# Patient Record
Sex: Female | Born: 1965 | Race: Black or African American | Hispanic: No | Marital: Married | Smoking: Never smoker
Health system: Southern US, Community
[De-identification: ages and names within clinical notes are randomized; demographics above are authoritative.]

## PROBLEM LIST (undated history)

## (undated) DIAGNOSIS — I10 Essential (primary) hypertension: Secondary | ICD-10-CM

---

## 2012-10-21 ENCOUNTER — Other Ambulatory Visit (HOSPITAL_COMMUNITY): Payer: Self-pay | Admitting: Endocrinology

## 2012-10-21 DIAGNOSIS — E0590 Thyrotoxicosis, unspecified without thyrotoxic crisis or storm: Secondary | ICD-10-CM

## 2012-10-21 DIAGNOSIS — E042 Nontoxic multinodular goiter: Secondary | ICD-10-CM

## 2012-11-10 ENCOUNTER — Encounter (HOSPITAL_COMMUNITY)
Admission: RE | Admit: 2012-11-10 | Discharge: 2012-11-10 | Disposition: A | Discharge status: Home / Self Care | Payer: Commercial Managed Care - PPO | Source: Ambulatory Visit | Attending: Endocrinology | Admitting: Endocrinology

## 2012-11-10 DIAGNOSIS — E042 Nontoxic multinodular goiter: Secondary | ICD-10-CM | POA: Insufficient documentation

## 2012-11-10 DIAGNOSIS — E0590 Thyrotoxicosis, unspecified without thyrotoxic crisis or storm: Secondary | ICD-10-CM | POA: Insufficient documentation

## 2012-11-11 ENCOUNTER — Encounter (HOSPITAL_COMMUNITY)
Admission: RE | Admit: 2012-11-11 | Discharge: 2012-11-11 | Disposition: A | Discharge status: Home / Self Care | Payer: Commercial Managed Care - PPO | Source: Ambulatory Visit | Attending: Endocrinology | Admitting: Endocrinology

## 2012-11-11 MED ORDER — SODIUM PERTECHNETATE TC 99M INJECTION
9.8000 | Freq: Once | INTRAVENOUS | Status: AC | PRN
  Administered 2012-11-11: 10 via INTRAVENOUS

## 2012-11-11 MED ORDER — SODIUM IODIDE I 131 CAPSULE
15.9000 | Freq: Once | INTRAVENOUS | Status: AC | PRN
  Administered 2012-11-10: 15.9 via ORAL

## 2012-11-20 ENCOUNTER — Other Ambulatory Visit: Payer: Self-pay | Admitting: Endocrinology

## 2012-11-20 DIAGNOSIS — E042 Nontoxic multinodular goiter: Secondary | ICD-10-CM

## 2012-12-03 ENCOUNTER — Inpatient Hospital Stay: Admission: RE | Admit: 2012-12-03 | Payer: Commercial Managed Care - PPO | Source: Ambulatory Visit

## 2012-12-03 ENCOUNTER — Inpatient Hospital Stay
Admission: RE | Admit: 2012-12-03 | Discharge: 2012-12-03 | Disposition: A | Discharge status: Home / Self Care | Payer: Commercial Managed Care - PPO | Source: Ambulatory Visit | Attending: Endocrinology | Admitting: Endocrinology

## 2012-12-09 ENCOUNTER — Ambulatory Visit
Admission: RE | Admit: 2012-12-09 | Discharge: 2012-12-09 | Disposition: A | Discharge status: Home / Self Care | Payer: Commercial Managed Care - PPO | Source: Ambulatory Visit | Attending: Endocrinology | Admitting: Endocrinology

## 2012-12-09 ENCOUNTER — Other Ambulatory Visit (HOSPITAL_COMMUNITY)
Admission: RE | Admit: 2012-12-09 | Discharge: 2012-12-09 | Disposition: A | Discharge status: Home / Self Care | Payer: Commercial Managed Care - PPO | Source: Ambulatory Visit | Attending: Interventional Radiology | Admitting: Interventional Radiology

## 2012-12-09 DIAGNOSIS — E049 Nontoxic goiter, unspecified: Secondary | ICD-10-CM | POA: Insufficient documentation

## 2012-12-09 DIAGNOSIS — E042 Nontoxic multinodular goiter: Secondary | ICD-10-CM

## 2014-03-04 IMAGING — US US THYROID BIOPSY
1 series · 13 of 25 positions shown · non-contrast
Comparison: none

CLINICAL DATA: Multinodular goiter.  Previous FNA biopsy of
dominant left lesion 09/12/2009.

ULTRASOUND-GUIDED THYROID ASPIRATION BIOPSY, RIGHT
TECHNIQUE: Survey ultrasound was performed and the dominant lesion
in the mid right lobe was localized.  An appropriate skin entry
site was determined.  Skin was marked, then prepped with Betadine,
draped in usual sterile fashion, and infiltrated locally with 1%
lidocaine.  Under real-time ultrasound guidance, 3  passes were
made into the lesion with 25 gauge needles.  The patient tolerated
procedure well, with no immediate complications.
IMPRESSION
1.  Technically successful ultrasound-guided thyroid aspiration
biopsy, dominant right nodule
.
ULTRASOUND-GUIDED THYROID ASPIRATION BIOPSY, ISTHMUS
in the
Isthmus   was localized.  An appropriate skin entry site was
determined.  Skin was marked, then prepped with Betadine, draped in
usual sterile fashion, and infiltrated locally with 1% lidocaine.
Under real-time ultrasound guidance, 3  passes were made into the
lesion with 25 gauge needles.  The patient tolerated procedure
well, with no immediate complications.
biopsy, dominant isthmus nodule
ULTRASOUND-GUIDED THYROID ASPIRATION BIOPSY, LEFT
in the mid-left lobe was localized.  An appropriate skin entry site
was determined.  Skin was marked, then prepped with Betadine,
made into the lesion with 25 gauge needles sampling both solid and
cystic components.  The patient tolerated procedure well, with no
immediate complications.
biopsy, dominant left nodule

[Series 1: us thyroid biopsy · 0.09mm/px · 33 acquisitions, 13 frames shown]
[im 1/33]
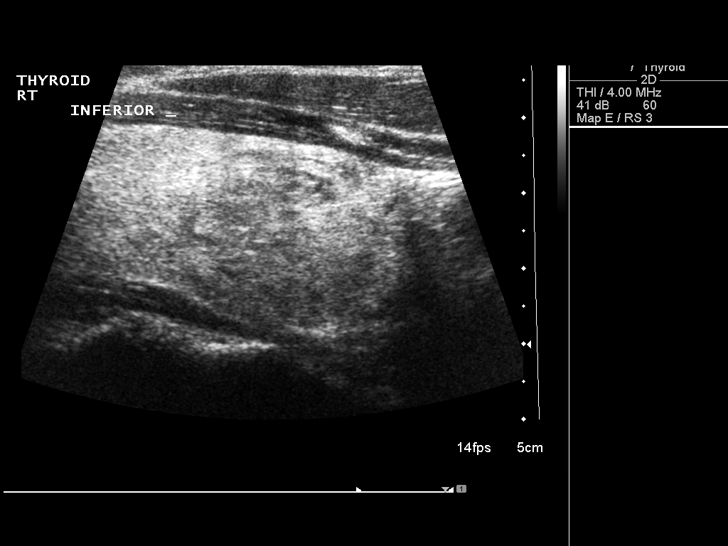
[im 3/33]
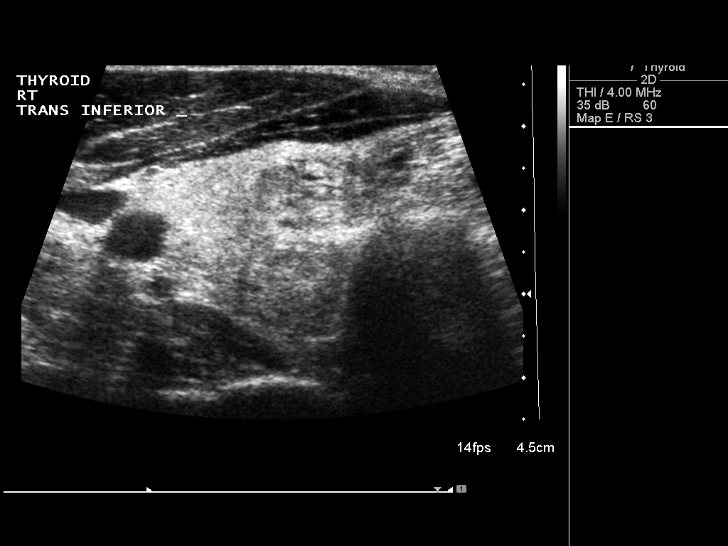
[im 6/33]
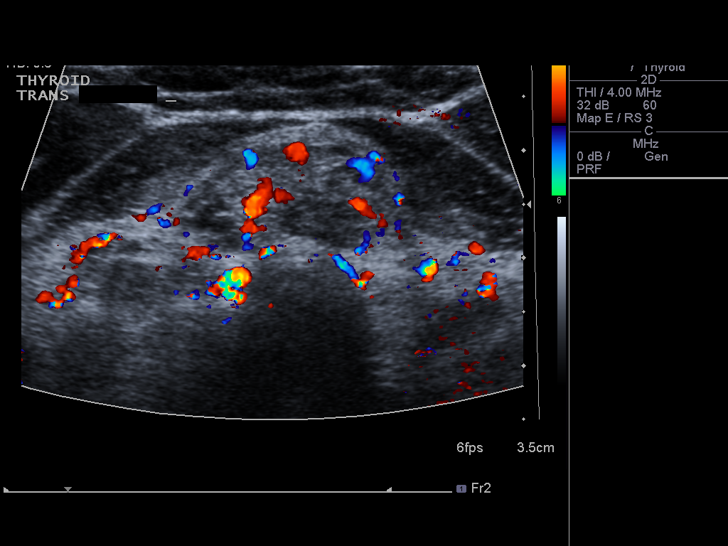
[im 9/33]
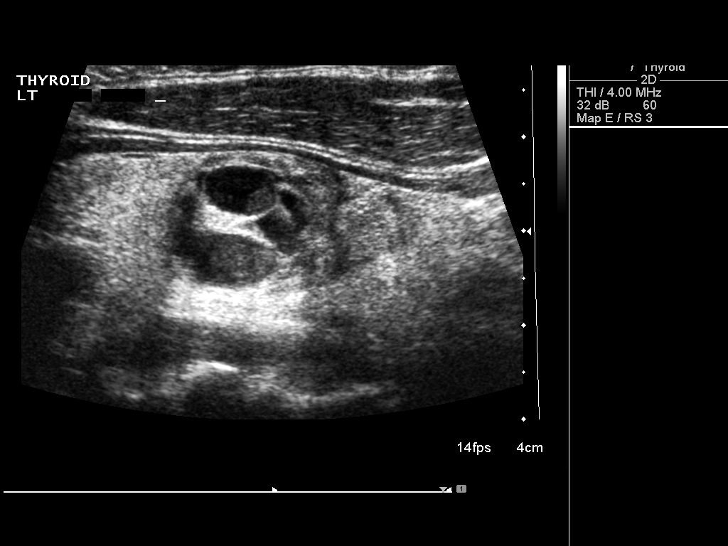
[im 11/33]
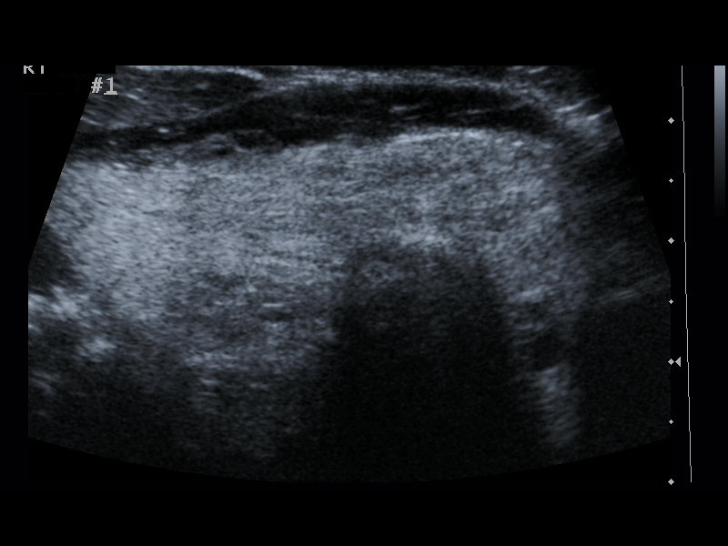
[im 14/33]
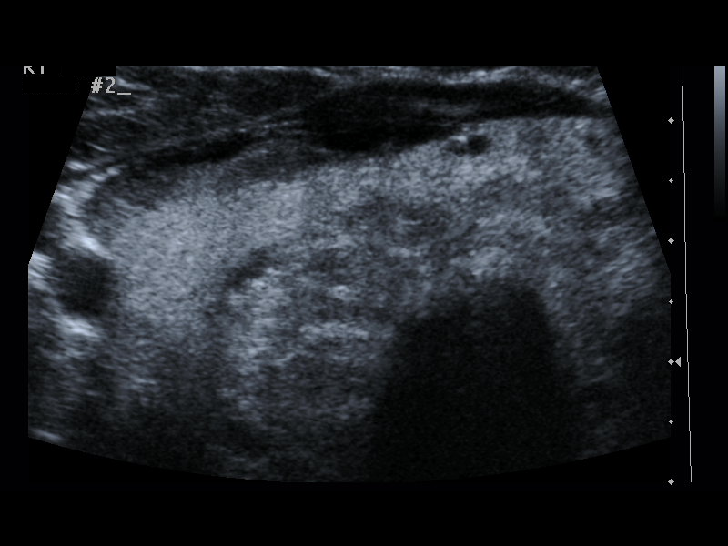
[im 17/33]
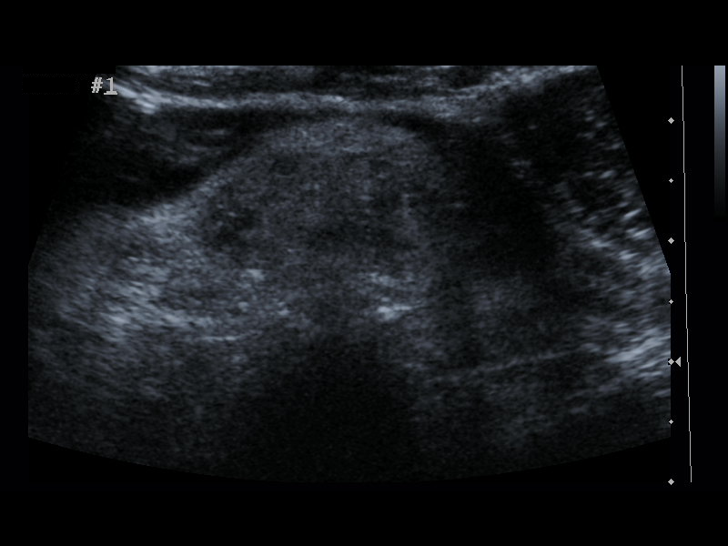
[im 19/33]
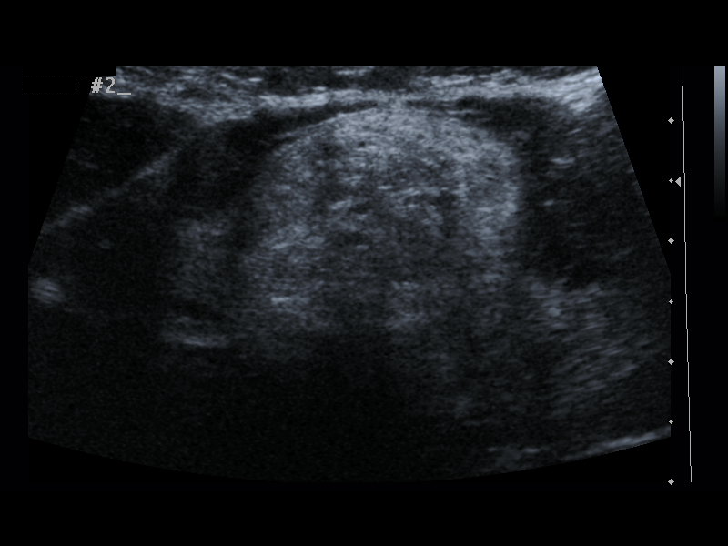
[im 22/33]
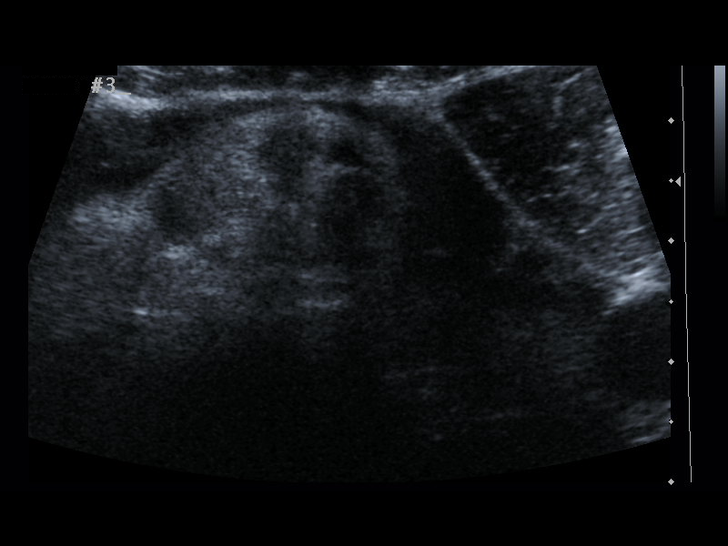
[im 25/33]
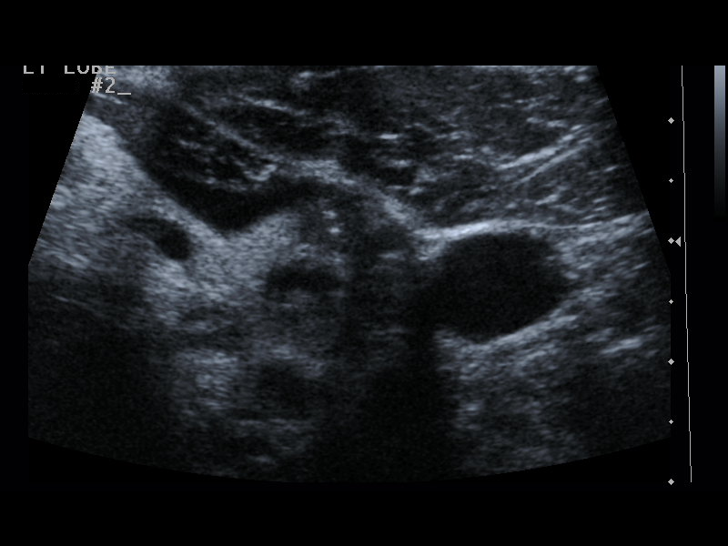
[im 27/33]
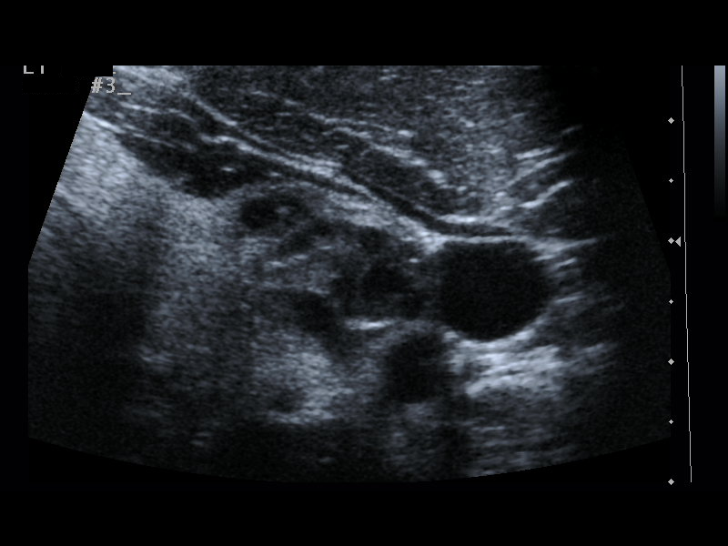
[im 30/33]
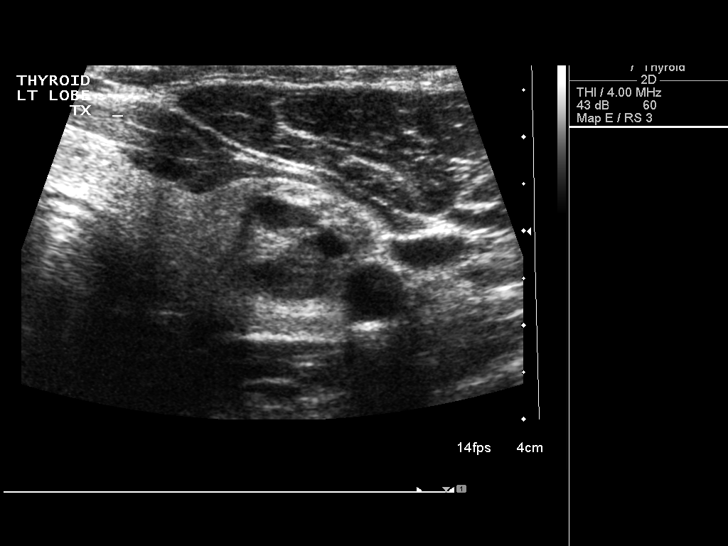
[im 33/33]
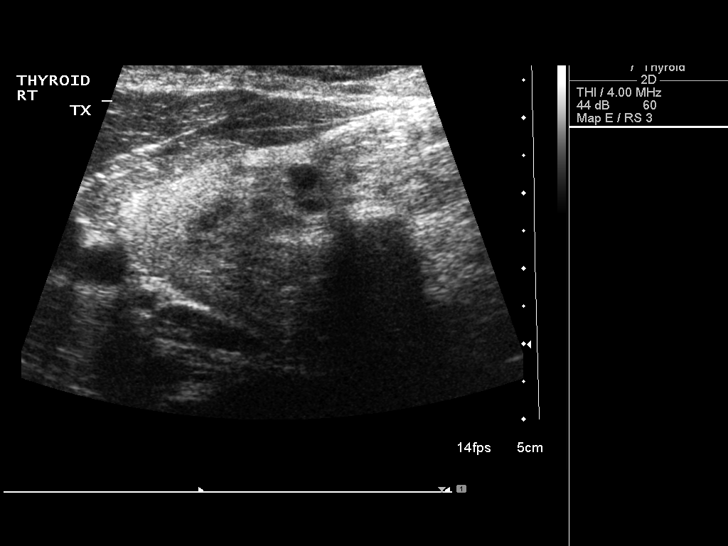

[13 of 25 positions shown; findings below may reference images not displayed]

## 2016-12-04 DIAGNOSIS — J069 Acute upper respiratory infection, unspecified: Secondary | ICD-10-CM | POA: Diagnosis not present

## 2017-03-25 DIAGNOSIS — Z1389 Encounter for screening for other disorder: Secondary | ICD-10-CM | POA: Diagnosis not present

## 2017-03-25 DIAGNOSIS — E0590 Thyrotoxicosis, unspecified without thyrotoxic crisis or storm: Secondary | ICD-10-CM | POA: Diagnosis not present

## 2017-03-25 DIAGNOSIS — E559 Vitamin D deficiency, unspecified: Secondary | ICD-10-CM | POA: Diagnosis not present

## 2017-03-25 DIAGNOSIS — J309 Allergic rhinitis, unspecified: Secondary | ICD-10-CM | POA: Diagnosis not present

## 2017-03-25 DIAGNOSIS — I10 Essential (primary) hypertension: Secondary | ICD-10-CM | POA: Diagnosis not present

## 2017-07-01 ENCOUNTER — Ambulatory Visit: Payer: Self-pay | Admitting: Podiatry

## 2017-07-05 ENCOUNTER — Encounter (INDEPENDENT_AMBULATORY_CARE_PROVIDER_SITE_OTHER): Payer: Self-pay

## 2017-07-05 ENCOUNTER — Encounter: Payer: Self-pay | Admitting: Sports Medicine

## 2017-07-05 ENCOUNTER — Ambulatory Visit (INDEPENDENT_AMBULATORY_CARE_PROVIDER_SITE_OTHER): Payer: Commercial Managed Care - PPO | Admitting: Sports Medicine

## 2017-07-05 ENCOUNTER — Ambulatory Visit (INDEPENDENT_AMBULATORY_CARE_PROVIDER_SITE_OTHER): Payer: Commercial Managed Care - PPO

## 2017-07-05 VITALS — BP 142/82 | HR 74 | Ht 66.0 in | Wt 256.0 lb

## 2017-07-05 DIAGNOSIS — M7752 Other enthesopathy of left foot: Secondary | ICD-10-CM

## 2017-07-05 DIAGNOSIS — M7661 Achilles tendinitis, right leg: Secondary | ICD-10-CM

## 2017-07-05 DIAGNOSIS — M7662 Achilles tendinitis, left leg: Secondary | ICD-10-CM | POA: Diagnosis not present

## 2017-07-05 DIAGNOSIS — M779 Enthesopathy, unspecified: Secondary | ICD-10-CM | POA: Diagnosis not present

## 2017-07-05 DIAGNOSIS — M722 Plantar fascial fibromatosis: Secondary | ICD-10-CM

## 2017-07-05 DIAGNOSIS — M79672 Pain in left foot: Secondary | ICD-10-CM | POA: Diagnosis not present

## 2017-07-05 DIAGNOSIS — M79671 Pain in right foot: Secondary | ICD-10-CM | POA: Diagnosis not present

## 2017-07-05 MED ORDER — METHYLPREDNISOLONE 4 MG PO TBPK: ORAL_TABLET | ORAL | 0 refills | Status: AC

## 2017-07-05 MED ORDER — MELOXICAM 15 MG PO TABS: 15.0000 mg | ORAL_TABLET | Freq: Every day | ORAL | 0 refills | Status: AC

## 2017-07-05 NOTE — Patient Instructions (Signed)
Plantar Fasciitis Rehab Ask your health care provider which exercises are safe for you. Do exercises exactly as told by your health care provider and adjust them as directed. It is normal to feel mild stretching, pulling, tightness, or discomfort as you do these exercises, but you should stop right away if you feel sudden pain or your pain gets worse. Do not begin these exercises until told by your health care provider. Stretching and range of motion exercises These exercises warm up your muscles and joints and improve the movement and flexibility of your foot. These exercises also help to relieve pain. Exercise A: Plantar fascia stretch  1. Sit with your left / right leg crossed over your opposite knee. 2. Hold your heel with one hand with that thumb near your arch. With your other hand, hold your toes and gently pull them back toward the top of your foot. You should feel a stretch on the bottom of your toes or your foot or both. 3. Hold this stretch for__________ seconds. 4. Slowly release your toes and return to the starting position. Repeat __________ times. Complete this exercise __________ times a day. Exercise B: Gastroc, standing  1. Stand with your hands against a wall. 2. Extend your left / right leg behind you, and bend your front knee slightly. 3. Keeping your heels on the floor and keeping your back knee straight, shift your weight toward the wall without arching your back. You should feel a gentle stretch in your left / right calf. 4. Hold this position for __________ seconds. Repeat __________ times. Complete this exercise __________ times a day. Exercise C: Soleus, standing 1. Stand with your hands against a wall. 2. Extend your left / right leg behind you, and bend your front knee slightly. 3. Keeping your heels on the floor, bend your back knee and slightly shift your weight over the back leg. You should feel a gentle stretch deep in your calf. 4. Hold this position for  __________ seconds. Repeat __________ times. Complete this exercise __________ times a day. Exercise D: Gastrocsoleus, standing 1. Stand with the ball of your left / right foot on a step. The ball of your foot is on the walking surface, right under your toes. 2. Keep your other foot firmly on the same step. 3. Hold onto the wall or a railing for balance. 4. Slowly lift your other foot, allowing your body weight to press your heel down over the edge of the step. You should feel a stretch in your left / right calf. 5. Hold this position for __________ seconds. 6. Return both feet to the step. 7. Repeat this exercise with a slight bend in your left / right knee. Repeat __________ times with your left / right knee straight and __________ times with your left / right knee bent. Complete this exercise __________ times a day. Balance exercise This exercise builds your balance and strength control of your arch to help take pressure off your plantar fascia. Exercise E: Single leg stand 1. Without shoes, stand near a railing or in a doorway. You may hold onto the railing or door frame as needed. 2. Stand on your left / right foot. Keep your big toe down on the floor and try to keep your arch lifted. Do not let your foot roll inward. 3. Hold this position for __________ seconds. 4. If this exercise is too easy, you can try it with your eyes closed or while standing on a pillow. Repeat __________ times. Complete  this exercise __________ times a day. This information is not intended to replace advice given to you by your health care provider. Make sure you discuss any questions you have with your health care provider. Document Released: 10/01/2005 Document Revised: 06/05/2016 Document Reviewed: 08/15/2015 Elsevier Interactive Patient Education  2018 [employer]  Achilles Tendinitis Achilles tendinitis is inflammation of the tough, cord-like band that attaches the lower leg muscles to the heel bone  (Achilles tendon). This is usually caused by overusing the tendon and the ankle joint. Achilles tendinitis usually gets better over time with treatment and caring for yourself at home. It can take weeks or months to heal completely. What are the causes? This condition may be caused by:  A sudden increase in exercise or activity, such as running.  Doing the same exercises or activities (such as jumping) over and over.  Not warming up calf muscles before exercising.  Exercising in shoes that are worn out or not made for exercise.  Having arthritis or a bone growth (spur) on the back of the heel bone. This can rub against the tendon and hurt it.  Age-related wear and tear. Tendons become less flexible with age and more likely to be injured.  What are the signs or symptoms? Common symptoms of this condition include:  Pain in the Achilles tendon or in the back of the leg, just above the heel. The pain usually gets worse with exercise.  Stiffness or soreness in the back of the leg, especially in the morning.  Swelling of the skin over the Achilles tendon.  Thickening of the tendon.  Bone spurs at the bottom of the Achilles tendon, near the heel.  Trouble standing on tiptoe.  How is this diagnosed? This condition is diagnosed based on your symptoms and a physical exam. You may have tests, including:  X-rays.  MRI.  How is this treated? The goal of treatment is to relieve symptoms and help your injury heal. Treatment may include:  Decreasing or stopping activities that caused the tendinitis. This may mean switching to low-impact exercises like biking or swimming.  Icing the injured area.  Doing physical therapy, including strengthening and stretching exercises.  NSAIDs to help relieve pain and swelling.  Using supportive shoes, wraps, heel lifts, or a walking boot (air cast).  Surgery. This may be done if your symptoms do not improve after 6 months.  Using high-energy  shock wave impulses to stimulate the healing process (extracorporeal shock wave therapy). This is rare.  Injection of medicines to help relieve inflammation (corticosteroids). This is rare.  Follow these instructions at home: If you have an air cast:  Wear the cast as told by your health care provider. Remove it only as told by your health care provider.  Loosen the cast if your toes tingle, become numb, or turn cold and blue. Activity  Gradually return to your normal activities once your health care provider approves. Do not do activities that cause pain. ? Consider doing low-impact exercises, like cycling or swimming.  If you have an air cast, ask your health care provider when it is safe for you to drive.  If physical therapy was prescribed, do exercises as told by your health care provider or physical therapist. Managing pain, stiffness, and swelling  Raise (elevate) your foot above the level of your heart while you are sitting or lying down.  Move your toes often to avoid stiffness and to lessen swelling.  If directed, put ice on the injured area: ?   Put ice in a plastic bag. ? Place a towel between your skin and the bag. ? Leave the ice on for 20 minutes, 2-3 times a day General instructions  If directed, wrap your foot with an elastic bandage or other wrap. This can help keep your tendon from moving too much while it heals. Your health care provider will show you how to wrap your foot correctly.  Wear supportive shoes or heel lifts only as told by your health care provider.  Take over-the-counter and prescription medicines only as told by your health care provider.  Keep all follow-up visits as told by your health care provider. This is important. Contact a health care provider if:  You have symptoms that gets worse.  You have pain that does not get better with medicine.  You develop new, unexplained symptoms.  You develop warmth and swelling in your foot.  You  have a fever. Get help right away if:  You have a sudden popping sound or sensation in your Achilles tendon followed by severe pain.  You cannot move your toes or foot.  You cannot put any weight on your foot. Summary  Achilles tendinitis is inflammation of the tough, cord-like band that attaches the lower leg muscles to the heel bone (Achilles tendon).  This condition is usually caused by overusing the tendon and the ankle joint. It can also be caused by arthritis or normal aging.  The most common symptoms of this condition include pain, swelling, or stiffness in the Achilles tendon or in the back of the leg.  This condition is usually treated with rest, NSAIDs, and physical therapy. This information is not intended to replace advice given to you by your health care provider. Make sure you discuss any questions you have with your health care provider. Document Released: 07/11/2005 Document Revised: 08/20/2016 Document Reviewed: 08/20/2016 Elsevier Interactive Patient Education  2017 [employer]  

## 2017-07-06 DIAGNOSIS — I10 Essential (primary) hypertension: Secondary | ICD-10-CM | POA: Diagnosis not present

## 2017-07-06 DIAGNOSIS — M5430 Sciatica, unspecified side: Secondary | ICD-10-CM | POA: Diagnosis not present

## 2017-07-06 NOTE — Progress Notes (Signed)
Subjective: Kelsey Hicks is a 51 y.o. female patient presents to office with complaint of heel pain on the left = right. 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 Patient has treated this problem with changing shoes with some relief. 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  Denies any other pedal complaints.   Review of Systems  Constitutional: Negative.   HENT: Negative.   Eyes: Negative.   Respiratory: Negative.   Cardiovascular: Negative.   Gastrointestinal: Negative.   Genitourinary: Negative.   Musculoskeletal: Positive for joint pain.  Skin: Negative.   Neurological: Negative.   Endo/Heme/Allergies: Negative.   Psychiatric/Behavioral: Negative.     There are no active problems to display for this patient.   No current outpatient prescriptions on file prior to visit.   No current facility-administered medications on file prior to visit.     No Known Allergies  Objective: Physical Exam General: The patient is alert and oriented x3 in no acute distress.  Dermatology: Skin is warm, dry and supple bilateral lower extremities. Nails 1-10 are normal. There is no erythema, edema, no eccymosis, no open lesions present. Integument is otherwise unremarkable.  Vascular: Dorsalis Pedis pulse and Posterior Tibial pulse are 2/4 bilateral. Capillary fill time is immediate to all digits.  Neurological: Grossly intact to light touch with an achilles reflex of +2/5 and a  negative Tinel's sign bilateral.  Musculoskeletal: Mild tenderness to palpation at the medial calcaneal tubercale and through the insertion of the plantar fascia on the left and right foot. Tenderness to achilles insertion with exostosis present bilateral. No gap or dell. Thompson sign negative. No pain with compression of calcaneus bilateral. No pain with tuning fork  to calcaneus bilateral. No pain with calf compression bilateral. There is decreased Ankle joint range of motion bilateral. All other joints range of motion within normal limits bilateral. Pes planus. Strength 5/5 in all groups bilateral.   Xray, Right/Left foot:  Normal osseous mineralization. Joint spaces preserved except at midtarsal joint where there is breach supportive of planus foot type. No fracture/dislocation/boney destruction. Calcaneal spur present with  thickening of plantar fascia. No other soft tissue abnormalities or radiopaque foreign bodies.   Assessment and Plan: Problem List Items Addressed This Visit    None    Visit Diagnoses    Achilles tendonitis, bilateral    -  Primary   Relevant Medications   methylPREDNISolone (MEDROL DOSEPAK) 4 MG TBPK tablet   meloxicam (MOBIC) 15 MG tablet   Other Relevant Orders   DG Foot Complete Right   DG Foot Complete Left   Plantar fasciitis, bilateral       Relevant Medications   methylPREDNISolone (MEDROL DOSEPAK) 4 MG TBPK tablet   meloxicam (MOBIC) 15 MG tablet   Foot pain, bilateral          -Complete examination performed.  -Xrays reviewed -Discussed with patient in detail the condition of achilles tendonitis and plantar fasciitis with heel spur, how this occurs and general treatment options. Explained both conservative and surgical treatments.  -Patient declined injection  -Rx Meloxicam to start after Medrol dose pack is completed -Recommended good supportive shoes and advised use of OTC insert. Explained to patient that if these orthoses work well, we will continue with these. If these do not improve her condition and  pain, we will consider  custom molded orthoses. - Explained in detail the use of the night splint which was dispensed at today's visit. -Explained and dispensed to patient daily stretching exercises. -Recommend patient to ice affected area 1-2x daily. -Patient to return to office in 4 weeks for follow up or  sooner if problems or questions arise.  Landis Martins, DPM

## 2017-07-09 ENCOUNTER — Other Ambulatory Visit: Payer: Self-pay | Admitting: Sports Medicine

## 2017-07-09 DIAGNOSIS — M7662 Achilles tendinitis, left leg: Principal | ICD-10-CM

## 2017-07-09 DIAGNOSIS — M7661 Achilles tendinitis, right leg: Secondary | ICD-10-CM

## 2017-07-10 DIAGNOSIS — M7062 Trochanteric bursitis, left hip: Secondary | ICD-10-CM | POA: Diagnosis not present

## 2017-07-10 DIAGNOSIS — M545 Low back pain: Secondary | ICD-10-CM | POA: Diagnosis not present

## 2017-07-31 ENCOUNTER — Ambulatory Visit: Payer: Commercial Managed Care - PPO | Admitting: Sports Medicine

## 2017-08-14 ENCOUNTER — Ambulatory Visit: Payer: Commercial Managed Care - PPO | Admitting: Sports Medicine

## 2017-08-26 DIAGNOSIS — N946 Dysmenorrhea, unspecified: Secondary | ICD-10-CM | POA: Diagnosis not present

## 2017-09-10 DIAGNOSIS — M7062 Trochanteric bursitis, left hip: Secondary | ICD-10-CM | POA: Diagnosis not present

## 2017-09-17 DIAGNOSIS — Z1231 Encounter for screening mammogram for malignant neoplasm of breast: Secondary | ICD-10-CM | POA: Diagnosis not present

## 2017-09-17 DIAGNOSIS — Z0000 Encounter for general adult medical examination without abnormal findings: Secondary | ICD-10-CM | POA: Diagnosis not present

## 2017-09-30 DIAGNOSIS — E0590 Thyrotoxicosis, unspecified without thyrotoxic crisis or storm: Secondary | ICD-10-CM | POA: Diagnosis not present

## 2017-09-30 DIAGNOSIS — N926 Irregular menstruation, unspecified: Secondary | ICD-10-CM | POA: Diagnosis not present

## 2017-11-08 DIAGNOSIS — D259 Leiomyoma of uterus, unspecified: Secondary | ICD-10-CM | POA: Diagnosis not present

## 2017-11-08 DIAGNOSIS — Z1231 Encounter for screening mammogram for malignant neoplasm of breast: Secondary | ICD-10-CM | POA: Diagnosis not present

## 2017-11-08 DIAGNOSIS — N83202 Unspecified ovarian cyst, left side: Secondary | ICD-10-CM | POA: Diagnosis not present

## 2017-11-08 DIAGNOSIS — N939 Abnormal uterine and vaginal bleeding, unspecified: Secondary | ICD-10-CM | POA: Diagnosis not present

## 2018-03-03 DIAGNOSIS — M5489 Other dorsalgia: Secondary | ICD-10-CM | POA: Diagnosis not present

## 2018-03-03 DIAGNOSIS — M25551 Pain in right hip: Secondary | ICD-10-CM | POA: Diagnosis not present

## 2018-03-03 DIAGNOSIS — M25552 Pain in left hip: Secondary | ICD-10-CM | POA: Diagnosis not present

## 2018-03-27 DIAGNOSIS — M5489 Other dorsalgia: Secondary | ICD-10-CM | POA: Diagnosis not present

## 2018-03-27 DIAGNOSIS — M25552 Pain in left hip: Secondary | ICD-10-CM | POA: Diagnosis not present

## 2018-03-27 DIAGNOSIS — M25551 Pain in right hip: Secondary | ICD-10-CM | POA: Diagnosis not present

## 2018-04-09 DIAGNOSIS — M5489 Other dorsalgia: Secondary | ICD-10-CM | POA: Diagnosis not present

## 2018-04-09 DIAGNOSIS — M25551 Pain in right hip: Secondary | ICD-10-CM | POA: Diagnosis not present

## 2018-04-09 DIAGNOSIS — M25552 Pain in left hip: Secondary | ICD-10-CM | POA: Diagnosis not present

## 2018-10-27 DIAGNOSIS — Z0000 Encounter for general adult medical examination without abnormal findings: Secondary | ICD-10-CM | POA: Diagnosis not present

## 2018-10-27 DIAGNOSIS — E559 Vitamin D deficiency, unspecified: Secondary | ICD-10-CM | POA: Diagnosis not present

## 2018-10-27 DIAGNOSIS — I10 Essential (primary) hypertension: Secondary | ICD-10-CM | POA: Diagnosis not present

## 2018-11-06 DIAGNOSIS — R635 Abnormal weight gain: Secondary | ICD-10-CM | POA: Diagnosis not present

## 2018-11-06 DIAGNOSIS — I10 Essential (primary) hypertension: Secondary | ICD-10-CM | POA: Diagnosis not present

## 2018-11-06 DIAGNOSIS — E785 Hyperlipidemia, unspecified: Secondary | ICD-10-CM | POA: Diagnosis not present

## 2018-11-17 DIAGNOSIS — Z1211 Encounter for screening for malignant neoplasm of colon: Secondary | ICD-10-CM | POA: Diagnosis not present

## 2018-11-17 DIAGNOSIS — Z1212 Encounter for screening for malignant neoplasm of rectum: Secondary | ICD-10-CM | POA: Diagnosis not present

## 2018-12-08 DIAGNOSIS — R635 Abnormal weight gain: Secondary | ICD-10-CM | POA: Diagnosis not present

## 2018-12-08 DIAGNOSIS — I10 Essential (primary) hypertension: Secondary | ICD-10-CM | POA: Diagnosis not present

## 2018-12-08 DIAGNOSIS — Z6841 Body Mass Index (BMI) 40.0 and over, adult: Secondary | ICD-10-CM | POA: Diagnosis not present

## 2019-02-26 DIAGNOSIS — R002 Palpitations: Secondary | ICD-10-CM | POA: Diagnosis not present

## 2019-10-14 ENCOUNTER — Ambulatory Visit: Payer: Commercial Managed Care - PPO | Admitting: Allergy and Immunology

## 2019-10-21 ENCOUNTER — Ambulatory Visit: Payer: Commercial Managed Care - PPO | Admitting: Allergy and Immunology

## 2022-03-08 LAB — COLOGUARD: COLOGUARD: NEGATIVE

## 2022-07-23 DIAGNOSIS — Z79899 Other long term (current) drug therapy: Secondary | ICD-10-CM | POA: Diagnosis not present

## 2022-07-23 DIAGNOSIS — E559 Vitamin D deficiency, unspecified: Secondary | ICD-10-CM | POA: Diagnosis not present

## 2022-07-23 DIAGNOSIS — E049 Nontoxic goiter, unspecified: Secondary | ICD-10-CM | POA: Diagnosis not present

## 2022-07-23 DIAGNOSIS — R739 Hyperglycemia, unspecified: Secondary | ICD-10-CM | POA: Diagnosis not present

## 2022-07-23 DIAGNOSIS — E785 Hyperlipidemia, unspecified: Secondary | ICD-10-CM | POA: Diagnosis not present

## 2022-09-17 DIAGNOSIS — R635 Abnormal weight gain: Secondary | ICD-10-CM | POA: Diagnosis not present

## 2022-09-17 DIAGNOSIS — R0602 Shortness of breath: Secondary | ICD-10-CM | POA: Diagnosis not present

## 2022-09-17 DIAGNOSIS — J309 Allergic rhinitis, unspecified: Secondary | ICD-10-CM | POA: Diagnosis not present

## 2022-09-17 DIAGNOSIS — I10 Essential (primary) hypertension: Secondary | ICD-10-CM | POA: Diagnosis not present

## 2022-10-24 DIAGNOSIS — I10 Essential (primary) hypertension: Secondary | ICD-10-CM | POA: Diagnosis not present

## 2022-10-24 DIAGNOSIS — Z6841 Body Mass Index (BMI) 40.0 and over, adult: Secondary | ICD-10-CM | POA: Diagnosis not present

## 2022-10-24 DIAGNOSIS — R635 Abnormal weight gain: Secondary | ICD-10-CM | POA: Diagnosis not present

## 2023-01-21 DIAGNOSIS — Z0000 Encounter for general adult medical examination without abnormal findings: Secondary | ICD-10-CM | POA: Diagnosis not present

## 2023-01-21 DIAGNOSIS — E559 Vitamin D deficiency, unspecified: Secondary | ICD-10-CM | POA: Diagnosis not present

## 2023-01-21 DIAGNOSIS — R739 Hyperglycemia, unspecified: Secondary | ICD-10-CM | POA: Diagnosis not present

## 2023-01-21 DIAGNOSIS — Z6841 Body Mass Index (BMI) 40.0 and over, adult: Secondary | ICD-10-CM | POA: Diagnosis not present

## 2023-01-28 DIAGNOSIS — Z1231 Encounter for screening mammogram for malignant neoplasm of breast: Secondary | ICD-10-CM | POA: Diagnosis not present

## 2023-01-29 DIAGNOSIS — R051 Acute cough: Secondary | ICD-10-CM | POA: Diagnosis not present

## 2023-01-29 DIAGNOSIS — R0981 Nasal congestion: Secondary | ICD-10-CM | POA: Diagnosis not present

## 2023-07-22 DIAGNOSIS — I10 Essential (primary) hypertension: Secondary | ICD-10-CM | POA: Diagnosis not present

## 2023-07-22 DIAGNOSIS — E785 Hyperlipidemia, unspecified: Secondary | ICD-10-CM | POA: Diagnosis not present

## 2023-07-22 DIAGNOSIS — Z79899 Other long term (current) drug therapy: Secondary | ICD-10-CM | POA: Diagnosis not present

## 2023-07-22 DIAGNOSIS — R739 Hyperglycemia, unspecified: Secondary | ICD-10-CM | POA: Diagnosis not present

## 2023-07-22 DIAGNOSIS — E0590 Thyrotoxicosis, unspecified without thyrotoxic crisis or storm: Secondary | ICD-10-CM | POA: Diagnosis not present

## 2023-07-22 DIAGNOSIS — E559 Vitamin D deficiency, unspecified: Secondary | ICD-10-CM | POA: Diagnosis not present

## 2023-07-22 DIAGNOSIS — E049 Nontoxic goiter, unspecified: Secondary | ICD-10-CM | POA: Diagnosis not present

## 2023-08-13 DIAGNOSIS — Z6841 Body Mass Index (BMI) 40.0 and over, adult: Secondary | ICD-10-CM | POA: Diagnosis not present

## 2023-08-13 DIAGNOSIS — E785 Hyperlipidemia, unspecified: Secondary | ICD-10-CM | POA: Diagnosis not present

## 2023-08-13 DIAGNOSIS — R635 Abnormal weight gain: Secondary | ICD-10-CM | POA: Diagnosis not present

## 2023-08-13 DIAGNOSIS — R739 Hyperglycemia, unspecified: Secondary | ICD-10-CM | POA: Diagnosis not present

## 2023-09-23 DIAGNOSIS — R635 Abnormal weight gain: Secondary | ICD-10-CM | POA: Diagnosis not present

## 2023-09-23 DIAGNOSIS — Z6841 Body Mass Index (BMI) 40.0 and over, adult: Secondary | ICD-10-CM | POA: Diagnosis not present

## 2023-10-22 DIAGNOSIS — Z6839 Body mass index (BMI) 39.0-39.9, adult: Secondary | ICD-10-CM | POA: Diagnosis not present

## 2023-10-22 DIAGNOSIS — R635 Abnormal weight gain: Secondary | ICD-10-CM | POA: Diagnosis not present

## 2023-11-19 DIAGNOSIS — R635 Abnormal weight gain: Secondary | ICD-10-CM | POA: Diagnosis not present

## 2023-11-19 DIAGNOSIS — Z6839 Body mass index (BMI) 39.0-39.9, adult: Secondary | ICD-10-CM | POA: Diagnosis not present

## 2023-12-24 DIAGNOSIS — Z6838 Body mass index (BMI) 38.0-38.9, adult: Secondary | ICD-10-CM | POA: Diagnosis not present

## 2023-12-24 DIAGNOSIS — R635 Abnormal weight gain: Secondary | ICD-10-CM | POA: Diagnosis not present

## 2024-01-20 DIAGNOSIS — R739 Hyperglycemia, unspecified: Secondary | ICD-10-CM | POA: Diagnosis not present

## 2024-01-20 DIAGNOSIS — E785 Hyperlipidemia, unspecified: Secondary | ICD-10-CM | POA: Diagnosis not present

## 2024-01-20 DIAGNOSIS — E0590 Thyrotoxicosis, unspecified without thyrotoxic crisis or storm: Secondary | ICD-10-CM | POA: Diagnosis not present

## 2024-01-20 DIAGNOSIS — E559 Vitamin D deficiency, unspecified: Secondary | ICD-10-CM | POA: Diagnosis not present

## 2024-01-20 DIAGNOSIS — Z79899 Other long term (current) drug therapy: Secondary | ICD-10-CM | POA: Diagnosis not present

## 2024-01-27 DIAGNOSIS — E559 Vitamin D deficiency, unspecified: Secondary | ICD-10-CM | POA: Diagnosis not present

## 2024-01-27 DIAGNOSIS — E0590 Thyrotoxicosis, unspecified without thyrotoxic crisis or storm: Secondary | ICD-10-CM | POA: Diagnosis not present

## 2024-01-27 DIAGNOSIS — N182 Chronic kidney disease, stage 2 (mild): Secondary | ICD-10-CM | POA: Diagnosis not present

## 2024-01-27 DIAGNOSIS — R635 Abnormal weight gain: Secondary | ICD-10-CM | POA: Diagnosis not present

## 2024-02-09 DIAGNOSIS — R051 Acute cough: Secondary | ICD-10-CM | POA: Diagnosis not present

## 2024-02-09 DIAGNOSIS — J069 Acute upper respiratory infection, unspecified: Secondary | ICD-10-CM | POA: Diagnosis not present

## 2024-02-24 DIAGNOSIS — R635 Abnormal weight gain: Secondary | ICD-10-CM | POA: Diagnosis not present

## 2024-02-24 DIAGNOSIS — Z6838 Body mass index (BMI) 38.0-38.9, adult: Secondary | ICD-10-CM | POA: Diagnosis not present

## 2024-02-24 DIAGNOSIS — I10 Essential (primary) hypertension: Secondary | ICD-10-CM | POA: Diagnosis not present

## 2024-03-23 DIAGNOSIS — R635 Abnormal weight gain: Secondary | ICD-10-CM | POA: Diagnosis not present

## 2024-03-23 DIAGNOSIS — Z6838 Body mass index (BMI) 38.0-38.9, adult: Secondary | ICD-10-CM | POA: Diagnosis not present

## 2024-04-20 DIAGNOSIS — I10 Essential (primary) hypertension: Secondary | ICD-10-CM | POA: Diagnosis not present

## 2024-04-20 DIAGNOSIS — R635 Abnormal weight gain: Secondary | ICD-10-CM | POA: Diagnosis not present

## 2024-04-20 DIAGNOSIS — Z6837 Body mass index (BMI) 37.0-37.9, adult: Secondary | ICD-10-CM | POA: Diagnosis not present

## 2024-05-25 DIAGNOSIS — Z6837 Body mass index (BMI) 37.0-37.9, adult: Secondary | ICD-10-CM | POA: Diagnosis not present

## 2024-05-25 DIAGNOSIS — R635 Abnormal weight gain: Secondary | ICD-10-CM | POA: Diagnosis not present

## 2024-05-25 DIAGNOSIS — I10 Essential (primary) hypertension: Secondary | ICD-10-CM | POA: Diagnosis not present

## 2024-06-11 DIAGNOSIS — L851 Acquired keratosis [keratoderma] palmaris et plantaris: Secondary | ICD-10-CM | POA: Diagnosis not present

## 2024-06-25 DIAGNOSIS — L851 Acquired keratosis [keratoderma] palmaris et plantaris: Secondary | ICD-10-CM | POA: Diagnosis not present

## 2024-06-29 DIAGNOSIS — I10 Essential (primary) hypertension: Secondary | ICD-10-CM | POA: Diagnosis not present

## 2024-06-29 DIAGNOSIS — Z6836 Body mass index (BMI) 36.0-36.9, adult: Secondary | ICD-10-CM | POA: Diagnosis not present

## 2024-06-29 DIAGNOSIS — R635 Abnormal weight gain: Secondary | ICD-10-CM | POA: Diagnosis not present

## 2024-07-16 DIAGNOSIS — L851 Acquired keratosis [keratoderma] palmaris et plantaris: Secondary | ICD-10-CM | POA: Diagnosis not present

## 2024-07-20 DIAGNOSIS — Z0000 Encounter for general adult medical examination without abnormal findings: Secondary | ICD-10-CM | POA: Diagnosis not present

## 2024-07-20 DIAGNOSIS — R739 Hyperglycemia, unspecified: Secondary | ICD-10-CM | POA: Diagnosis not present

## 2024-07-20 DIAGNOSIS — Z6837 Body mass index (BMI) 37.0-37.9, adult: Secondary | ICD-10-CM | POA: Diagnosis not present

## 2024-07-20 DIAGNOSIS — E559 Vitamin D deficiency, unspecified: Secondary | ICD-10-CM | POA: Diagnosis not present

## 2024-07-27 DIAGNOSIS — R635 Abnormal weight gain: Secondary | ICD-10-CM | POA: Diagnosis not present

## 2024-07-27 DIAGNOSIS — R739 Hyperglycemia, unspecified: Secondary | ICD-10-CM | POA: Diagnosis not present

## 2024-07-27 DIAGNOSIS — E0590 Thyrotoxicosis, unspecified without thyrotoxic crisis or storm: Secondary | ICD-10-CM | POA: Diagnosis not present

## 2024-07-27 DIAGNOSIS — I10 Essential (primary) hypertension: Secondary | ICD-10-CM | POA: Diagnosis not present

## 2024-08-25 DIAGNOSIS — R635 Abnormal weight gain: Secondary | ICD-10-CM | POA: Diagnosis not present

## 2024-08-25 DIAGNOSIS — Z6837 Body mass index (BMI) 37.0-37.9, adult: Secondary | ICD-10-CM | POA: Diagnosis not present

## 2024-08-25 DIAGNOSIS — I10 Essential (primary) hypertension: Secondary | ICD-10-CM | POA: Diagnosis not present

## 2024-08-26 ENCOUNTER — Encounter (HOSPITAL_BASED_OUTPATIENT_CLINIC_OR_DEPARTMENT_OTHER): Payer: Self-pay

## 2024-08-26 ENCOUNTER — Ambulatory Visit (HOSPITAL_BASED_OUTPATIENT_CLINIC_OR_DEPARTMENT_OTHER): Admission: EM | Admit: 2024-08-26 | Discharge: 2024-08-26 | Disposition: A | Discharge status: Home / Self Care

## 2024-08-26 ENCOUNTER — Ambulatory Visit (HOSPITAL_BASED_OUTPATIENT_CLINIC_OR_DEPARTMENT_OTHER): Admitting: Radiology

## 2024-08-26 DIAGNOSIS — M542 Cervicalgia: Secondary | ICD-10-CM

## 2024-08-26 DIAGNOSIS — M47812 Spondylosis without myelopathy or radiculopathy, cervical region: Secondary | ICD-10-CM | POA: Diagnosis not present

## 2024-08-26 HISTORY — DX: Essential (primary) hypertension: I10
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
## 2024-08-26 NOTE — Discharge Instructions (Addendum)
 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 It is mild. This can cause neck pain and stiffness. You may have irritated the muscles around the neck also.  You can alternate heat/ice to the area. Ibuprofen and Tylenol for pain. If you start developing numbness, tingling or weakness in the arms see your PCP.
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
## 2024-09-14 DIAGNOSIS — L259 Unspecified contact dermatitis, unspecified cause: Secondary | ICD-10-CM | POA: Diagnosis not present

## 2024-09-14 DIAGNOSIS — J069 Acute upper respiratory infection, unspecified: Secondary | ICD-10-CM | POA: Diagnosis not present

## 2024-09-14 DIAGNOSIS — Z6836 Body mass index (BMI) 36.0-36.9, adult: Secondary | ICD-10-CM | POA: Diagnosis not present

## 2024-09-15 DIAGNOSIS — L851 Acquired keratosis [keratoderma] palmaris et plantaris: Secondary | ICD-10-CM | POA: Diagnosis not present

## 2024-09-22 DIAGNOSIS — I10 Essential (primary) hypertension: Secondary | ICD-10-CM | POA: Diagnosis not present

## 2024-09-22 DIAGNOSIS — R635 Abnormal weight gain: Secondary | ICD-10-CM | POA: Diagnosis not present

## 2024-09-22 DIAGNOSIS — Z6837 Body mass index (BMI) 37.0-37.9, adult: Secondary | ICD-10-CM | POA: Diagnosis not present

## 2024-10-09 DIAGNOSIS — R0981 Nasal congestion: Secondary | ICD-10-CM | POA: Diagnosis not present

## 2024-10-09 DIAGNOSIS — J111 Influenza due to unidentified influenza virus with other respiratory manifestations: Secondary | ICD-10-CM | POA: Diagnosis not present

## 2024-10-09 DIAGNOSIS — R051 Acute cough: Secondary | ICD-10-CM | POA: Diagnosis not present

## 2024-10-09 DIAGNOSIS — R509 Fever, unspecified: Secondary | ICD-10-CM | POA: Diagnosis not present
